# Patient Record
Sex: Female | Born: 1975 | Race: White | Hispanic: No | Marital: Married | Smoking: Former smoker
Health system: Southern US, Community
[De-identification: ages and names within clinical notes are randomized; demographics above are authoritative.]

## PROBLEM LIST (undated history)

## (undated) DIAGNOSIS — K3580 Unspecified acute appendicitis: Secondary | ICD-10-CM

---

## 2005-01-25 ENCOUNTER — Observation Stay: Payer: Self-pay

## 2005-01-25 ENCOUNTER — Inpatient Hospital Stay: Payer: Self-pay

## 2007-03-28 ENCOUNTER — Emergency Department: Payer: Self-pay | Admitting: Emergency Medicine

## 2008-03-28 ENCOUNTER — Ambulatory Visit: Payer: Self-pay | Admitting: Internal Medicine

## 2008-12-04 ENCOUNTER — Inpatient Hospital Stay: Payer: Self-pay

## 2010-07-02 ENCOUNTER — Ambulatory Visit: Payer: Self-pay | Admitting: Internal Medicine

## 2013-04-04 ENCOUNTER — Ambulatory Visit: Payer: Self-pay | Admitting: Obstetrics & Gynecology

## 2014-07-18 ENCOUNTER — Encounter: Payer: Self-pay | Admitting: Emergency Medicine

## 2014-07-18 DIAGNOSIS — D72829 Elevated white blood cell count, unspecified: Secondary | ICD-10-CM | POA: Diagnosis not present

## 2014-07-18 DIAGNOSIS — Z87891 Personal history of nicotine dependence: Secondary | ICD-10-CM | POA: Diagnosis not present

## 2014-07-18 DIAGNOSIS — K353 Acute appendicitis with localized peritonitis: Secondary | ICD-10-CM | POA: Diagnosis not present

## 2014-07-18 DIAGNOSIS — R300 Dysuria: Secondary | ICD-10-CM | POA: Insufficient documentation

## 2014-07-18 DIAGNOSIS — R1031 Right lower quadrant pain: Secondary | ICD-10-CM | POA: Diagnosis present

## 2014-07-18 DIAGNOSIS — K3580 Unspecified acute appendicitis: Secondary | ICD-10-CM | POA: Diagnosis present

## 2014-07-18 LAB — COMPREHENSIVE METABOLIC PANEL
ALK PHOS: 41 U/L (ref 38–126)
ALT: 14 U/L (ref 14–54)
AST: 18 U/L (ref 15–41)
Albumin: 4.5 g/dL (ref 3.5–5.0)
Anion gap: 8 (ref 5–15)
BUN: 14 mg/dL (ref 6–20)
CO2: 26 mmol/L (ref 22–32)
CREATININE: 0.86 mg/dL (ref 0.44–1.00)
Calcium: 9.7 mg/dL (ref 8.9–10.3)
Chloride: 105 mmol/L (ref 101–111)
GFR calc Af Amer: >60 mL/min (ref >60)
GFR calc non Af Amer: >60 mL/min (ref >60)
Glucose, Bld: 123 mg/dL — ABNORMAL HIGH (ref 65–99)
Potassium: 3.8 mmol/L (ref 3.5–5.1)
Sodium: 139 mmol/L (ref 135–145)
Total Bilirubin: 0.4 mg/dL (ref 0.3–1.2)
Total Protein: 7.6 g/dL (ref 6.5–8.1)

## 2014-07-18 LAB — TROPONIN I: Troponin I: <0.03 ng/mL (ref <0.031)

## 2014-07-18 LAB — LIPASE, BLOOD: LIPASE: 28 U/L (ref 22–51)

## 2014-07-18 LAB — CBC WITH DIFFERENTIAL/PLATELET
Basophils Absolute: 0.1 10*3/uL (ref 0–0.1)
Basophils Relative: 1 %
EOS ABS: 0 10*3/uL (ref 0–0.7)
EOS PCT: 0 %
HCT: 39 % (ref 35.0–47.0)
Hemoglobin: 12.9 g/dL (ref 12.0–16.0)
LYMPHS ABS: 1.8 10*3/uL (ref 1.0–3.6)
Lymphocytes Relative: 13 %
MCH: 29.3 pg (ref 26.0–34.0)
MCHC: 33 g/dL (ref 32.0–36.0)
MCV: 88.7 fL (ref 80.0–100.0)
MONOS PCT: 4 %
Monocytes Absolute: 0.6 10*3/uL (ref 0.2–0.9)
Neutro Abs: 12.1 10*3/uL — ABNORMAL HIGH (ref 1.4–6.5)
Neutrophils Relative %: 82 %
Platelets: 224 10*3/uL (ref 150–440)
RBC: 4.39 MIL/uL (ref 3.80–5.20)
RDW: 15.2 % — ABNORMAL HIGH (ref 11.5–14.5)
WBC: 14.7 10*3/uL — ABNORMAL HIGH (ref 3.6–11.0)

## 2014-07-18 MED ORDER — ONDANSETRON 4 MG PO TBDP
ORAL_TABLET | ORAL | Status: AC
  Administered 2014-07-18: 4 mg via ORAL
  Filled 2014-07-18: qty 1

## 2014-07-18 MED ORDER — ONDANSETRON 4 MG PO TBDP
4.0000 mg | ORAL_TABLET | Freq: Once | ORAL | Status: AC
  Administered 2014-07-18: 4 mg via ORAL

## 2014-07-18 MED ORDER — OXYCODONE-ACETAMINOPHEN 5-325 MG PO TABS
1.0000 | ORAL_TABLET | Freq: Once | ORAL | Status: AC
  Administered 2014-07-18: 1 via ORAL

## 2014-07-18 MED ORDER — OXYCODONE-ACETAMINOPHEN 5-325 MG PO TABS
ORAL_TABLET | ORAL | Status: AC
  Administered 2014-07-18: 1 via ORAL
  Filled 2014-07-18: qty 1

## 2014-07-18 NOTE — ED Notes (Addendum)
Patient here with complaint of upper abd pain times one week. Patient reports when the pain initially started it was epigastric pain but tonight she is having right lower abd pain and pain with urination. Patient also reports nausea but no vomiting.

## 2014-07-19 ENCOUNTER — Observation Stay
Admission: EM | Admit: 2014-07-19 | Discharge: 2014-07-20 | Disposition: A | Discharge status: Home / Self Care | Payer: No Typology Code available for payment source | Attending: Surgery | Admitting: Surgery

## 2014-07-19 ENCOUNTER — Observation Stay: Payer: No Typology Code available for payment source | Admitting: Anesthesiology

## 2014-07-19 ENCOUNTER — Encounter
Admission: EM | Disposition: A | Discharge status: Home / Self Care | Payer: Self-pay | Source: Home / Self Care | Attending: Emergency Medicine

## 2014-07-19 ENCOUNTER — Emergency Department: Payer: No Typology Code available for payment source

## 2014-07-19 DIAGNOSIS — K3580 Unspecified acute appendicitis: Secondary | ICD-10-CM

## 2014-07-19 DIAGNOSIS — R1031 Right lower quadrant pain: Secondary | ICD-10-CM

## 2014-07-19 HISTORY — PX: LAPAROSCOPIC APPENDECTOMY: SHX408

## 2014-07-19 HISTORY — DX: Unspecified acute appendicitis: K35.80

## 2014-07-19 LAB — URINALYSIS COMPLETE WITH MICROSCOPIC (ARMC ONLY)
Bilirubin Urine: NEGATIVE
GLUCOSE, UA: NEGATIVE mg/dL
Hgb urine dipstick: NEGATIVE
Ketones, ur: NEGATIVE mg/dL
Leukocytes, UA: NEGATIVE
Nitrite: NEGATIVE
Protein, ur: NEGATIVE mg/dL
Specific Gravity, Urine: 1.019 (ref 1.005–1.030)
pH: 5 (ref 5.0–8.0)

## 2014-07-19 LAB — PREGNANCY, URINE: Preg Test, Ur: NEGATIVE

## 2014-07-19 SURGERY — APPENDECTOMY, LAPAROSCOPIC
Anesthesia: General

## 2014-07-19 MED ORDER — SODIUM CHLORIDE 0.9 % IV BOLUS (SEPSIS)
1000.0000 mL | Freq: Once | INTRAVENOUS | Status: AC
  Administered 2014-07-19: 1000 mL via INTRAVENOUS

## 2014-07-19 MED ORDER — ENOXAPARIN SODIUM 40 MG/0.4ML ~~LOC~~ SOLN
40.0000 mg | SUBCUTANEOUS | Status: DC
  Administered 2014-07-20: 40 mg via SUBCUTANEOUS
  Filled 2014-07-19 (×3): qty 0.4

## 2014-07-19 MED ORDER — PROPOFOL 10 MG/ML IV BOLUS
INTRAVENOUS | Status: DC | PRN
  Administered 2014-07-19: 170 mg via INTRAVENOUS

## 2014-07-19 MED ORDER — BUPIVACAINE-EPINEPHRINE (PF) 0.25% -1:200000 IJ SOLN
INTRAMUSCULAR | Status: DC | PRN
  Administered 2014-07-19: 2 mL via PERINEURAL

## 2014-07-19 MED ORDER — MORPHINE SULFATE 4 MG/ML IJ SOLN
4.0000 mg | INTRAMUSCULAR | Status: DC | PRN
  Administered 2014-07-19 – 2014-07-20 (×3): 4 mg via INTRAVENOUS
  Filled 2014-07-19 (×3): qty 1

## 2014-07-19 MED ORDER — FENTANYL CITRATE (PF) 100 MCG/2ML IJ SOLN
INTRAMUSCULAR | Status: DC | PRN
  Administered 2014-07-19: 100 ug via INTRAVENOUS
  Administered 2014-07-19 (×3): 50 ug via INTRAVENOUS

## 2014-07-19 MED ORDER — LIDOCAINE HCL (CARDIAC) 20 MG/ML IV SOLN
INTRAVENOUS | Status: DC | PRN
  Administered 2014-07-19: 60 mg via INTRAVENOUS

## 2014-07-19 MED ORDER — BUPIVACAINE-EPINEPHRINE (PF) 0.25% -1:200000 IJ SOLN
INTRAMUSCULAR | Status: AC
  Filled 2014-07-19: qty 30

## 2014-07-19 MED ORDER — FENTANYL CITRATE (PF) 100 MCG/2ML IJ SOLN: 25.0000 ug | INTRAMUSCULAR | Status: DC | PRN

## 2014-07-19 MED ORDER — SUGAMMADEX SODIUM 200 MG/2ML IV SOLN
INTRAVENOUS | Status: DC | PRN
  Administered 2014-07-19: 165.6 mg via INTRAVENOUS

## 2014-07-19 MED ORDER — SODIUM CHLORIDE 0.9 % IV BOLUS (SEPSIS): 1000.0000 mL | Freq: Once | INTRAVENOUS | Status: DC

## 2014-07-19 MED ORDER — HYDROMORPHONE HCL 1 MG/ML IJ SOLN
0.2500 mg | INTRAMUSCULAR | Status: DC | PRN
  Administered 2014-07-19 (×4): 0.25 mg via INTRAVENOUS

## 2014-07-19 MED ORDER — MORPHINE SULFATE 4 MG/ML IJ SOLN
INTRAMUSCULAR | Status: AC
  Filled 2014-07-19: qty 1

## 2014-07-19 MED ORDER — IOHEXOL 300 MG/ML  SOLN
100.0000 mL | Freq: Once | INTRAMUSCULAR | Status: AC | PRN
  Administered 2014-07-19: 100 mL via INTRAVENOUS

## 2014-07-19 MED ORDER — ONDANSETRON HCL 4 MG/2ML IJ SOLN
INTRAMUSCULAR | Status: DC | PRN
  Administered 2014-07-19: 4 mg via INTRAVENOUS

## 2014-07-19 MED ORDER — LIDOCAINE HCL (PF) 1 % IJ SOLN
INTRAMUSCULAR | Status: AC
  Filled 2014-07-19: qty 30

## 2014-07-19 MED ORDER — SUCCINYLCHOLINE CHLORIDE 20 MG/ML IJ SOLN
INTRAMUSCULAR | Status: DC | PRN
  Administered 2014-07-19: 100 mg via INTRAVENOUS

## 2014-07-19 MED ORDER — LACTATED RINGERS IV SOLN
INTRAVENOUS | Status: DC | PRN
  Administered 2014-07-19 (×2): via INTRAVENOUS

## 2014-07-19 MED ORDER — ONDANSETRON HCL 4 MG/2ML IJ SOLN: 4.0000 mg | Freq: Once | INTRAMUSCULAR | Status: DC | PRN

## 2014-07-19 MED ORDER — LIDOCAINE HCL 1 % IJ SOLN
INTRAMUSCULAR | Status: DC | PRN
  Administered 2014-07-19: 2 mL

## 2014-07-19 MED ORDER — SCOPOLAMINE 1 MG/3DAYS TD PT72
1.0000 | MEDICATED_PATCH | TRANSDERMAL | Status: DC
  Administered 2014-07-19: 1.5 mg via TRANSDERMAL
  Filled 2014-07-19: qty 1

## 2014-07-19 MED ORDER — HYDROMORPHONE HCL 1 MG/ML IJ SOLN
INTRAMUSCULAR | Status: AC
  Filled 2014-07-19: qty 1

## 2014-07-19 MED ORDER — ONDANSETRON HCL 4 MG/2ML IJ SOLN
INTRAMUSCULAR | Status: AC
  Filled 2014-07-19: qty 2

## 2014-07-19 MED ORDER — ONDANSETRON HCL 4 MG/2ML IJ SOLN
4.0000 mg | Freq: Once | INTRAMUSCULAR | Status: AC
  Administered 2014-07-19: 4 mg via INTRAVENOUS

## 2014-07-19 MED ORDER — MORPHINE SULFATE 4 MG/ML IJ SOLN: 4.0000 mg | Freq: Once | INTRAMUSCULAR | Status: DC

## 2014-07-19 MED ORDER — HYDROMORPHONE HCL 1 MG/ML IJ SOLN: 0.2500 mg | INTRAMUSCULAR | Status: DC | PRN

## 2014-07-19 MED ORDER — MORPHINE SULFATE 4 MG/ML IJ SOLN
4.0000 mg | Freq: Once | INTRAMUSCULAR | Status: AC
  Administered 2014-07-19: 4 mg via INTRAVENOUS

## 2014-07-19 MED ORDER — CEFAZOLIN SODIUM 1-5 GM-% IV SOLN
1.0000 g | INTRAVENOUS | Status: AC
  Administered 2014-07-19: 1 g via INTRAVENOUS
  Filled 2014-07-19: qty 50

## 2014-07-19 MED ORDER — IOHEXOL 240 MG/ML SOLN
25.0000 mL | Freq: Once | INTRAMUSCULAR | Status: AC | PRN
  Administered 2014-07-19: 25 mL via ORAL

## 2014-07-19 MED ORDER — ROCURONIUM BROMIDE 100 MG/10ML IV SOLN
INTRAVENOUS | Status: DC | PRN
  Administered 2014-07-19: 5 mg via INTRAVENOUS
  Administered 2014-07-19: 25 mg via INTRAVENOUS

## 2014-07-19 MED ORDER — DEXAMETHASONE SODIUM PHOSPHATE 4 MG/ML IJ SOLN
INTRAMUSCULAR | Status: DC | PRN
  Administered 2014-07-19: 5 mg via INTRAVENOUS

## 2014-07-19 MED ORDER — ONDANSETRON HCL 4 MG/2ML IJ SOLN: 4.0000 mg | Freq: Once | INTRAMUSCULAR | Status: DC

## 2014-07-19 MED ORDER — MIDAZOLAM HCL 2 MG/2ML IJ SOLN
INTRAMUSCULAR | Status: DC | PRN
  Administered 2014-07-19: 3 mg via INTRAVENOUS
  Administered 2014-07-19: 2 mg via INTRAVENOUS

## 2014-07-19 SURGICAL SUPPLY — 44 items
APPLIER CLIP ROT 10 11.4 M/L (STAPLE) ×3
BAG COUNTER SPONGE EZ (MISCELLANEOUS) IMPLANT
BLADE SURG SZ11 CARB STEEL (BLADE) ×3 IMPLANT
CANISTER SUCT 1200ML W/VALVE (MISCELLANEOUS) ×3 IMPLANT
CATH TRAY 16F METER LATEX (MISCELLANEOUS) ×3 IMPLANT
CHLORAPREP W/TINT 26ML (MISCELLANEOUS) ×3 IMPLANT
CLIP APPLIE ROT 10 11.4 M/L (STAPLE) ×1 IMPLANT
CLOSURE WOUND 1/2 X4 (GAUZE/BANDAGES/DRESSINGS) ×1
COUNTER SPONGE BAG EZ (MISCELLANEOUS)
CUTTER LINEAR ENDO 35 ART THIN (STAPLE) ×3 IMPLANT
DRESSING TELFA 4X3 1S ST N-ADH (GAUZE/BANDAGES/DRESSINGS) ×3 IMPLANT
DRSG TEGADERM 2-3/8X2-3/4 SM (GAUZE/BANDAGES/DRESSINGS) ×9 IMPLANT
DRSG TEGADERM 2X2.25 PEDS (GAUZE/BANDAGES/DRESSINGS) ×3 IMPLANT
DRSG TELFA 3X8 NADH (GAUZE/BANDAGES/DRESSINGS) ×3 IMPLANT
ENDOLOOP SUT PDS II  0 18 (SUTURE) ×2
ENDOLOOP SUT PDS II 0 18 (SUTURE) ×1 IMPLANT
ENDOPOUCH RETRIEVER 10 (MISCELLANEOUS) IMPLANT
GLOVE BIO SURGEON STRL SZ7.5 (GLOVE) ×3 IMPLANT
GOWN STRL REUS W/ TWL LRG LVL3 (GOWN DISPOSABLE) ×2 IMPLANT
GOWN STRL REUS W/TWL LRG LVL3 (GOWN DISPOSABLE) ×4
IRRIGATION STRYKERFLOW (MISCELLANEOUS) IMPLANT
IRRIGATOR STRYKERFLOW (MISCELLANEOUS)
IV NS 1000ML (IV SOLUTION) ×2
IV NS 1000ML BAXH (IV SOLUTION) ×1 IMPLANT
KIT RM TURNOVER STRD PROC AR (KITS) ×3 IMPLANT
LABEL OR SOLS (LABEL) ×3 IMPLANT
NDL SAFETY 25GX1.5 (NEEDLE) ×3 IMPLANT
NS IRRIG 500ML POUR BTL (IV SOLUTION) ×3 IMPLANT
PACK LAP CHOLECYSTECTOMY (MISCELLANEOUS) ×3 IMPLANT
PAD GROUND ADULT SPLIT (MISCELLANEOUS) ×3 IMPLANT
RELOAD CUTTER ETS 35MM STAND (ENDOMECHANICALS) ×3 IMPLANT
SCISSORS METZENBAUM CVD 33 (INSTRUMENTS) ×3 IMPLANT
SEAL FOR SCOPE WARMER C3101 (MISCELLANEOUS) ×3 IMPLANT
SLEEVE ENDOPATH XCEL 5M (ENDOMECHANICALS) ×3 IMPLANT
STRIP CLOSURE SKIN 1/2X4 (GAUZE/BANDAGES/DRESSINGS) ×2 IMPLANT
SUT MNCRL 4-0 (SUTURE) ×2
SUT MNCRL 4-0 27XMFL (SUTURE) ×1
SUT VICRYL 0 AB UR-6 (SUTURE) ×3 IMPLANT
SUTURE MNCRL 4-0 27XMF (SUTURE) ×1 IMPLANT
SWABSTK COMLB BENZOIN TINCTURE (MISCELLANEOUS) ×3 IMPLANT
TROCAR XCEL BLUNT TIP 100MML (ENDOMECHANICALS) ×3 IMPLANT
TROCAR XCEL NON-BLD 5MMX100MML (ENDOMECHANICALS) ×3 IMPLANT
TUBING INSUFFLATOR HI FLOW (MISCELLANEOUS) ×3 IMPLANT
WATER STERILE IRR 1000ML POUR (IV SOLUTION) ×3 IMPLANT

## 2014-07-19 NOTE — H&P (Signed)
Amanda Johnson is a 39 y.o. female  seen in the emergency room with imaging and clinical presentation consistent with probable acute appendicitis.  HPI: She is an otherwise healthy young woman with a weeks worth of intermittent upper quadrant and midepigastric abdominal pain. She felt that she had a problem with significant gas and attempted over-the-counter remedies without any success. She had no other symptoms. Specifically, she had no history of nausea or vomiting. She had no fever or chills.  After lunch today which she normally she began to develop increased periumbilical and lower quadrant abdominal pain associated with anorexia. She had a chill this afternoon. She had no vomiting no diarrhea. The pain intensified primarily in the right lower quadrant and she presented to the emergency room.  She denies a history of hepatitis yellow jaundice pancreatitis peptic ulcer disease gallbladder disease or diverticulitis. Her only previous abdominal surgery was a C-section. She has had a normal vaginal delivery prior to the C-section. Laboratory values reveal slightly elevated white blood cell count and CT scan demonstrated what appeared to be a dilated retrocecal appendix with stranding and free fluid. The surgical service was consulted  Past Medical History  Diagnosis Date  . Appendicitis, acute 07/19/2014   History reviewed. No pertinent past surgical history. History   Social History  . Marital Status: Married    Spouse Name: N/A  . Number of Children: N/A  . Years of Education: N/A   Social History Main Topics  . Smoking status: Former Games developer  . Smokeless tobacco: Not on file  . Alcohol Use: Yes     Comment: occasionly  . Drug Use: No  . Sexual Activity: Not on file   Other Topics Concern  . None   Social History Narrative     Review of Systems  Constitutional: Positive for chills and malaise/fatigue. Negative for fever and weight loss.  HENT: Negative for congestion.   Eyes:  Negative for blurred vision and double vision.  Respiratory: Negative for cough and shortness of breath.   Cardiovascular: Negative for chest pain and orthopnea.  Gastrointestinal: Positive for abdominal pain. Negative for heartburn, nausea, vomiting, diarrhea and constipation.  Genitourinary: Negative for dysuria and frequency.  Musculoskeletal: Positive for back pain. Negative for myalgias.  Skin: Negative for itching and rash.  Neurological: Negative for dizziness, focal weakness and headaches.  Endo/Heme/Allergies: Negative.  Does not bruise/bleed easily.  Psychiatric/Behavioral: Negative for depression and memory loss. The patient is not nervous/anxious.      PHYSICAL EXAM: BP 116/75 mmHg  Pulse 68  Temp(Src) 98.5 F (36.9 C) (Oral)  Resp 18  Ht 5\' 4"  (1.626 m)  Wt 81.647 kg (180 lb)  BMI 30.88 kg/m2  SpO2 99%  LMP 06/25/2014 (Exact Date)  Physical Exam  Constitutional: She is oriented to person, place, and time. She appears well-developed and well-nourished. No distress.  HENT:  Head: Normocephalic and atraumatic.  Eyes: EOM are normal. Pupils are equal, round, and reactive to light.  Neck: Normal range of motion. Neck supple.  Cardiovascular: Normal rate, regular rhythm, normal heart sounds and intact distal pulses.   Pulmonary/Chest: Effort normal and breath sounds normal.  Abdominal: Soft. Bowel sounds are normal. There is tenderness. There is rebound and guarding.  Musculoskeletal: Normal range of motion. She exhibits no edema.  Neurological: She is alert and oriented to person, place, and time.  Skin: Skin is warm and dry.  Psychiatric: She has a normal mood and affect. Judgment normal.   She has marked  right lower quadrant tenderness with mild guarding and mild rebound. She has active bowel sounds.  Impression/Plan: I have independently reviewed her CT scan. She does have a dilated retrocecal appendix with mild free fluid and some stranding. There does not appear  to be any evidence of perforation.  I discussed the options available to her at length. Her husband was present. We discussed primary surgical intervention versus primary antibiotic therapy. Risks of both relative to the patient and her husband. They've chosen surgical intervention. We will admit her to the hospital and planned surgery later this morning. Risks benefits and options been outlined in detail. They're both in agreement.   Tiney Rouge III, MD  07/19/2014, 5:14 AM

## 2014-07-19 NOTE — Transfer of Care (Signed)
Immediate Anesthesia Transfer of Care Note  Patient: Amanda Johnson  Procedure(s) Performed: Procedure(s): APPENDECTOMY LAPAROSCOPIC (N/A)  Patient Location: PACU  Anesthesia Type:General  Level of Consciousness: awake, alert  and oriented  Airway & Oxygen Therapy: Patient Spontanous Breathing and Patient connected to nasal cannula oxygen  Post-op Assessment: Report given to RN and Post -op Vital signs reviewed and stable  Post vital signs: Reviewed and stable  Last Vitals:  Filed Vitals:   07/19/14 0650  BP: 100/60  Pulse: 68  Temp: 36.8 C  Resp: 16    Complications: No apparent anesthesia complications

## 2014-07-19 NOTE — Progress Notes (Signed)
She is doing well post surgery. She does not have any nausea. She has been able to ambulate to the bathroom and is taking liquids without problems. She has some mild incisional pain but no further right lower quadrant pain. She is very pleased with her progress thus far.

## 2014-07-19 NOTE — Anesthesia Preprocedure Evaluation (Addendum)
Anesthesia Evaluation  Patient identified by MRN, date of birth, ID band Patient awake    Reviewed: Allergy & Precautions, NPO status , Patient's Chart, lab work & pertinent test results  History of Anesthesia Complications (+) PONV and history of anesthetic complications  Airway Mallampati: II  TM Distance: >3 FB Neck ROM: Full    Dental no notable dental hx.    Pulmonary neg pulmonary ROS, former smoker,  breath sounds clear to auscultation  Pulmonary exam normal       Cardiovascular Exercise Tolerance: Good negative cardio ROS Normal cardiovascular examRhythm:Regular Rate:Normal     Neuro/Psych negative neurological ROS  negative psych ROS   GI/Hepatic negative GI ROS, Neg liver ROS,   Endo/Other  negative endocrine ROS  Renal/GU negative Renal ROS  negative genitourinary   Musculoskeletal negative musculoskeletal ROS (+)   Abdominal   Peds negative pediatric ROS (+)  Hematology negative hematology ROS (+)   Anesthesia Other Findings   Reproductive/Obstetrics negative OB ROS                            Anesthesia Physical Anesthesia Plan  ASA: II and emergent  Anesthesia Plan: General   Post-op Pain Management:    Induction: Intravenous  Airway Management Planned: Oral ETT  Additional Equipment:   Intra-op Plan:   Post-operative Plan: Extubation in OR  Informed Consent: I have reviewed the patients History and Physical, chart, labs and discussed the procedure including the risks, benefits and alternatives for the proposed anesthesia with the patient or authorized representative who has indicated his/her understanding and acceptance.   Dental advisory given  Plan Discussed with: CRNA and Surgeon  Anesthesia Plan Comments:         Anesthesia Quick Evaluation

## 2014-07-19 NOTE — ED Notes (Signed)
Patient transported to CT 

## 2014-07-19 NOTE — Addendum Note (Signed)
Addendum  created 07/19/14 1251 by Clovis Fredrickson, CRNA   Modules edited: Anesthesia Flowsheet

## 2014-07-19 NOTE — ED Provider Notes (Signed)
[facility] Emergency Department Provider Note  ____________________________________________  Time seen: Approximately 2:44 AM  I have reviewed the triage vital signs and the nursing notes.   HISTORY  Chief Complaint Abdominal Pain    HPI Amanda Johnson is a 39 y.o. female who presents with abdominal pain. Patient describes onset of upper abdominal pain approximately one week ago which she has been experiencing intermittently. Took 2 times tonight which alleviated upper abdominal pain. Today pain migrated into her right lower quadrant. Patient had one episode of painful urination. Patient complains of chills, bloating and nausea without vomiting. She had 2 normal bowel movements today. Appetite was okay but not as good as usual. Patient denies fever, chest pain, shortness of breath, diarrhea, headache, numbness, tingling. Patient denies recent travel or antibiotics use. Nothing makes the pain better or worse. She did attempt to take 2 more times which did not alleviate the lower abdominal pain. Denies pelvic complaints. Denies history of ovarian cysts or endometriosis.   History reviewed. No pertinent past medical history.  There are no active problems to display for this patient.  Past surgical history C-section  No current outpatient prescriptions on file.  Allergies Review of patient's allergies indicates no known allergies.  No family history on file.  Social History History  Substance Use Topics  . Smoking status: Former Games developer  . Smokeless tobacco: Not on file  . Alcohol Use: Yes     Comment: occasionly    Review of Systems Constitutional: No fever. Positive for chills. Eyes: No visual changes. ENT: No sore throat. Cardiovascular: Denies chest pain. Respiratory: Denies shortness of breath. Gastrointestinal: Positive for abdominal pain.  Positive for nausea, no vomiting.  No diarrhea.  No constipation. Genitourinary: Positive for  dysuria. Musculoskeletal: Negative for back pain. Skin: Negative for rash. Neurological: Negative for headaches, focal weakness or numbness.  10-point ROS otherwise negative.  ____________________________________________   PHYSICAL EXAM:  VITAL SIGNS: ED Triage Vitals  Enc Vitals Group     BP 07/18/14 2233 122/67 mmHg     Pulse Rate 07/18/14 2233 89     Resp 07/18/14 2233 18     Temp 07/18/14 2233 98.2 F (36.8 C)     Temp Source 07/18/14 2233 Oral     SpO2 07/18/14 2233 98 %     Weight 07/18/14 2233 180 lb (81.647 kg)     Height 07/18/14 2233  (1.626 m)     Head Cir --      Peak Flow --      Pain Score 07/18/14 2233 7     Pain Loc --      Pain Edu? --      Excl. in GC? --     Constitutional: Alert and oriented. Well appearing and in mild acute distress. Eyes: Conjunctivae are normal. PERRL. EOMI. Head: Atraumatic. Nose: No congestion/rhinnorhea. Mouth/Throat: Mucous membranes are mildly dry.  Oropharynx non-erythematous. Neck: No stridor.   Cardiovascular: Normal rate, regular rhythm. Grossly normal heart sounds.  Good peripheral circulation. Respiratory: Normal respiratory effort.  No retractions. Lungs CTAB. Gastrointestinal: Mildly distended. Moderately tender to palpation right upper and right lower quadrant. Tender maximally at McBurney's point without rebound or guarding. No abdominal bruits. No CVA tenderness. Musculoskeletal: No lower extremity tenderness nor edema.  No joint effusions. Neurologic:  Normal speech and language. No gross focal neurologic deficits are appreciated. Speech is normal.  Skin:  Skin is warm, dry and intact. No rash noted. Psychiatric: Mood and affect are  normal. Speech and behavior are normal.  ____________________________________________   LABS (all labs ordered are listed, but only abnormal results are displayed)  Labs Reviewed  CBC WITH DIFFERENTIAL/PLATELET - Abnormal; Notable for the following:    WBC 14.7 (*)    RDW  15.2 (*)    Neutro Abs 12.1 (*)    All other components within normal limits  COMPREHENSIVE METABOLIC PANEL - Abnormal; Notable for the following:    Glucose, Bld 123 (*)    All other components within normal limits  URINALYSIS COMPLETEWITH MICROSCOPIC ([facility] ONLY) - Abnormal; Notable for the following:    Color, Urine YELLOW (*)    APPearance CLEAR (*)    Bacteria, UA RARE (*)    Squamous Epithelial / LPF 0-5 (*)    All other components within normal limits  LIPASE, BLOOD  TROPONIN I  PREGNANCY, URINE  POC URINE PREG, ED   ____________________________________________  EKG  ED ECG REPORT I, SUNG,JADE J, the attending physician, personally viewed and interpreted this ECG.   Date: 07/19/2014  EKG Time: 0627  Rate: 58  Rhythm: sinus bradycardia  Axis: Normal  Intervals:none  ST&T Change: Nonspecific  ____________________________________________  RADIOLOGY  CT abdomen/pelvis discussed with Dr. Cherly Hensen: 1. Acute appendicitis noted, with associated soft tissue inflammation and trace free fluid tracking about the right lower quadrant. Appendix dilated to 9 mm in maximal diameter. No evidence of perforation or abscess formation at this time. The appendix is retrocecal in nature. 2. Mild prominence of the right ureter may reflect minimal associated inflammation. No evidence of hydronephrosis. 3. Nonspecific 1.1 cm hypodensity at the anterior aspect of the spleen could reflect a small cyst. 4. Disc space narrowing and endplate sclerotic change at L2-L3. ____________________________________________   PROCEDURES  Procedure(s) performed: None  Critical Care performed: No  ____________________________________________   INITIAL IMPRESSION / ASSESSMENT AND PLAN / ED COURSE  Pertinent labs & imaging results that were available during my care of the patient were reviewed by me and considered in my medical decision making (see chart for details).  39 year old female  presenting with abdominal pain initially onset upper pain now in right lower quadrant. Clinical exam and leukocytosis concerning for appendicitis. Plan for IV analgesia, antiemetic; will obtain CT abdomen/pelvis to evaluate infectious/inflammatory etiology.  ----------------------------------------- 4:33 AM on 07/19/2014 -----------------------------------------  CT results discussed with radiologist; patient and spouse updated of CT findings of acute appendicitis without perforation. Discussed case with Dr. Michela Pitcher who will evaluate patient in the ED for hospital admission and surgery. ____________________________________________   FINAL CLINICAL IMPRESSION(S) / ED DIAGNOSES  Final diagnoses:  Right lower quadrant abdominal pain  Acute appendicitis, unspecified acute appendicitis type      Irean Hong, MD 07/19/14 [id number]

## 2014-07-19 NOTE — Anesthesia Procedure Notes (Signed)
Procedure Name: Intubation Date/Time: 07/19/2014 10:25 AM Performed by: Clovis Fredrickson Pre-anesthesia Checklist: Patient identified, Emergency Drugs available, Suction available and Patient being monitored Patient Re-evaluated:Patient Re-evaluated prior to inductionOxygen Delivery Method: Circle system utilized Preoxygenation: Pre-oxygenation with 100% oxygen Intubation Type: IV induction, Cricoid Pressure applied and Rapid sequence Laryngoscope Size: Mac and 4 Grade View: Grade II Tube type: Oral Tube size: 7.0 mm Number of attempts: 1 Placement Confirmation: ETT inserted through vocal cords under direct vision,  positive ETCO2 and breath sounds checked- equal and bilateral Secured at: 21 cm Tube secured with: Tape Dental Injury: Teeth and Oropharynx as per pre-operative assessment

## 2014-07-19 NOTE — ED Notes (Signed)
Surgeon in to speak with pt

## 2014-07-19 NOTE — Progress Notes (Signed)
I have seen and evaluated Ms. Leppla.  No changes to H and P.  Proceed with appendectomy.

## 2014-07-19 NOTE — Brief Op Note (Signed)
07/19/2014  11:29 AM  PATIENT:  Lawson Radar  39 y.o. female  PRE-OPERATIVE DIAGNOSIS:  appendicitis  POST-OPERATIVE DIAGNOSIS:  same  PROCEDURE:  Procedure(s): APPENDECTOMY LAPAROSCOPIC (N/A)  SURGEON:  Surgeon(s) and Role:    * Ida Rogue, MD - Primary  PHYSICIAN ASSISTANT:   ASSISTANTS: none   ANESTHESIA:   general  EBL:  Total I/O In: 1000 [I.V.:1000] Out: 350 [Urine:350] EBL 20 ml  BLOOD ADMINISTERED:none  DRAINS: none   LOCAL MEDICATIONS USED:  LIDOCAINE   SPECIMEN:  Excision  DISPOSITION OF SPECIMEN:  PATHOLOGY  COUNTS:  YES  TOURNIQUET:  * No tourniquets in log *  DICTATION: .Note written in EPIC  PLAN OF CARE: Admit for overnight observation  PATIENT DISPOSITION:  PACU - hemodynamically stable.   Delay start of Pharmacological VTE agent (>24hrs) due to surgical blood loss or risk of bleeding: not applicable

## 2014-07-19 NOTE — Anesthesia Postprocedure Evaluation (Signed)
  Anesthesia Post-op Note  Patient: Amanda Johnson  Procedure(s) Performed: Procedure(s): APPENDECTOMY LAPAROSCOPIC (N/A)  Anesthesia type:General  Patient location: PACU  Post pain: Pain level controlled  Post assessment: Post-op Vital signs reviewed, Patient's Cardiovascular Status Stable, Respiratory Function Stable, Patent Airway and No signs of Nausea or vomiting  Post vital signs: Reviewed and stable  Last Vitals:  Filed Vitals:   07/19/14 1156  BP: 109/69  Pulse: 57  Temp:   Resp: 16    Level of consciousness: awake, alert  and patient cooperative  Complications: No apparent anesthesia complications

## 2014-07-19 NOTE — Op Note (Signed)
Preoperative dx: Appendicitis Postoperative dx: Appendicitis Procedure: Laparoscopic appendectomy  Surgeon: Dasani Crear Anesthesia: GETA EBL: 25 ml Specimen: Appendix.  Indication for procedure: Ms Vanmarter presented with 1 day of RLQ pain and clinical and radiographic signs of appendicitis.  We brought her to the OR for surgical management of appendicitis.  Details of procedure:  Informed consent was obtained.  Ms. Leath was brought to the or suite and laid on the OR table.  She was induced, intubated and general anesthesia administered.  Her abdomen was prepped and draped.  Time out performed.  Incision was made over umbilicus.  Deepened down to fascia and fascia incised.  Two stay sutures placed in fasciotomy, hassan trocar placed, abdomen was insufflated.  Two 5 mm trocars were placed under direct visualization in RLQ and suprapubic.  The appendix was visualized and remarkably inflammed.  Hole was made in mesoappendix and appendix transected flush with cecum using endoGIA stapler.  Mesoappendix was then divided with 2 firings of the endoGIA.  Appendix then removed using endocatch bag.  Abdomen irrigated, hemostasis obtained.  All trocars were then removed under direct visualization.  Supraumbilical fascia closed using previously placed stay sutures.  All skin sites closed with 4-0 monocryl interruped deep dermal sutures.  Steristrips, telfa and tegaderm were used to complete dressing.  Patient awoken, extubated and brought to PACU.  No immediate complications, needle, sponge and instrument count correct at end of procedure.

## 2014-07-20 MED ORDER — HYDROCODONE-ACETAMINOPHEN 5-325 MG PO TABS
1.0000 | ORAL_TABLET | ORAL | Status: DC | PRN
  Administered 2014-07-20: 2 via ORAL
  Filled 2014-07-20: qty 2

## 2014-07-20 MED ORDER — HYDROCODONE-ACETAMINOPHEN 5-325 MG PO TABS: 1.0000 | ORAL_TABLET | ORAL | Status: DC | PRN

## 2014-07-20 NOTE — Discharge Summary (Signed)
Admission Diagnosis: Acute Appendicitis Procedure Performed: Laparoscopic appendectomy  Discharge medications   Medication List    TAKE these medications        HYDROcodone-acetaminophen 5-325 MG per tablet  Commonly known as:  NORCO/VICODIN  Take 1-2 tablets by mouth every 4 (four) hours as needed for moderate pain.     loratadine 10 MG tablet  Commonly known as:  CLARITIN  Take 10 mg by mouth daily.       Indication for admission: Amanda Johnson presented with clinical and radiographic appendicitis.  She was admitted for management of appendicitis.  Hospital Course: Amanda Johnson underwent unremarkable appendectomy.  Postoperatively she was began on clear liquid diet with IV pain medications.  Her diet was advanced as tolerated and she progressed to PO pain meds. At time of discharge, Amanda Johnson was tolerating a regular diet with good oral pain control.

## 2014-07-20 NOTE — Discharge Instructions (Signed)
Do not drive on pain medications °Do not lift greater than 15 lbs for a period of 6 weeks °Call or return to ER if you develop fever greater than 101.5, nausea/vomiting, increased pain, redness/drainage from incisions °Take bandages off in 48 hours.  Okay to shower with bandages on or after they come off, no tub baths °

## 2014-07-20 NOTE — Progress Notes (Signed)
Surgery Progress Note  S: Doing well.  Pain improved, no nausea O: AF/VSS, good uop GEN: NAD/A&Ox3 ABD: soft, min tender, nondistended  A/P 39 yo s/p lap appendectomy, doing well - possible d/c later

## 2014-07-21 ENCOUNTER — Encounter: Payer: Self-pay | Admitting: Surgery

## 2014-07-22 LAB — SURGICAL PATHOLOGY

## 2014-07-31 ENCOUNTER — Ambulatory Visit: Payer: No Typology Code available for payment source | Admitting: Surgery

## 2016-05-08 IMAGING — CT CT ABD-PELV W/ CM
2 of 5 series · 16 of 46 positions shown, 18 images · IV contrast (omnipaque)
Comparison: Pelvic ultrasound performed 03/28/2007

CLINICAL DATA: Acute onset of upper abdominal pain for 1 week.
Right lower quadrant abdominal pain and dysuria. Nausea. Initial
encounter.

EXAM:
CT ABDOMEN AND PELVIS WITH CONTRAST
TECHNIQUE: Multidetector CT imaging of the abdomen and pelvis was performed
using the standard protocol following bolus administration of
intravenous contrast.
CONTRAST:  100mL OMNIPAQUE IOHEXOL 300 MG/ML  SOLN

[Series 2: routine abd pel with · axial · 0.71mm/px · z∈[-494,-88]mm · 13 of 91 slices shown, 15 images]
[im 5/91  soft-tissue]
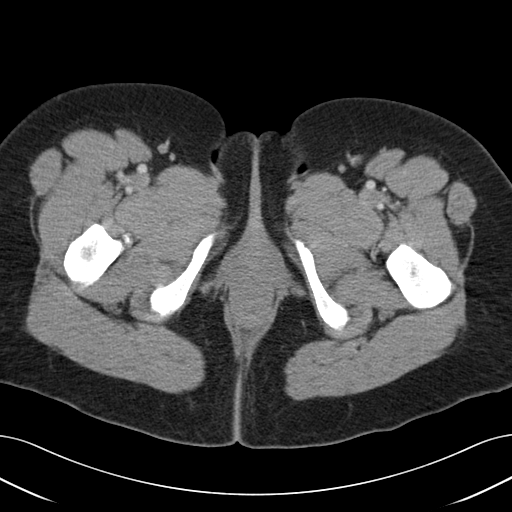
[im 5/91  bone]
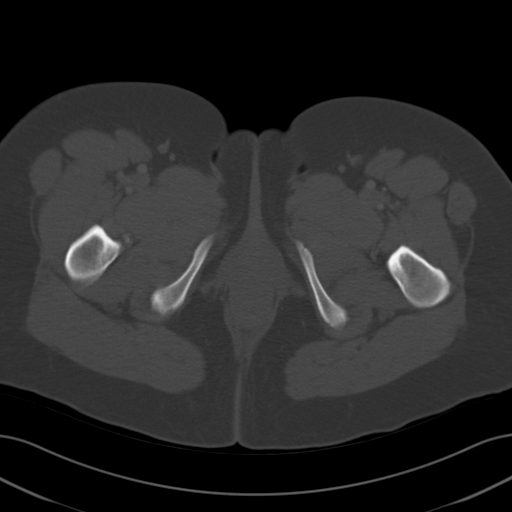
[im 15/91  soft-tissue]
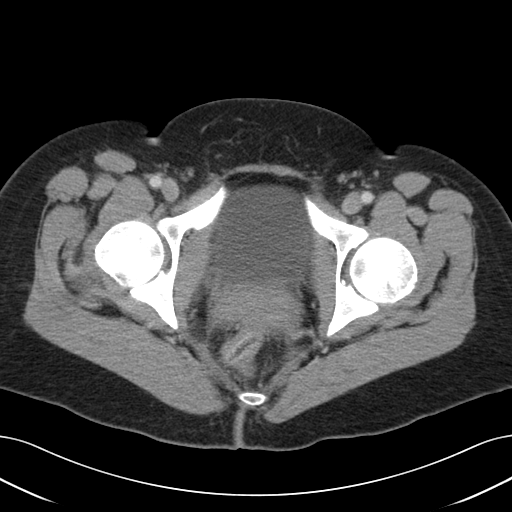
[im 19/91  soft-tissue]
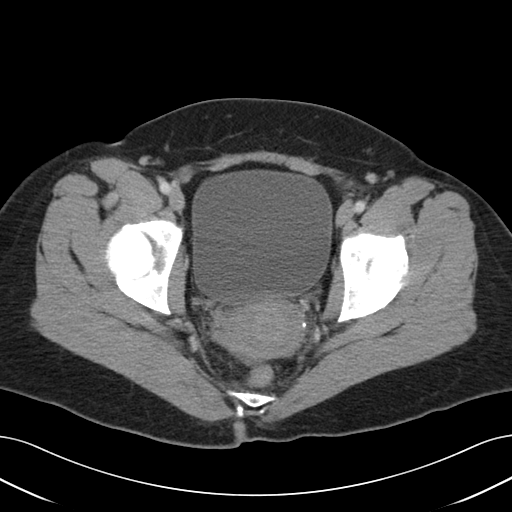
[im 24/91  soft-tissue]
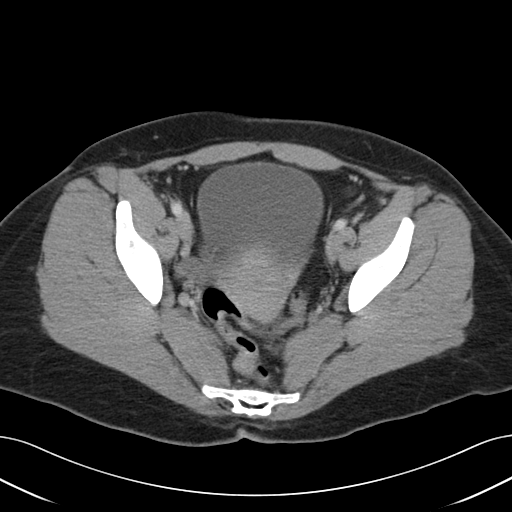
[im 34/91  soft-tissue]
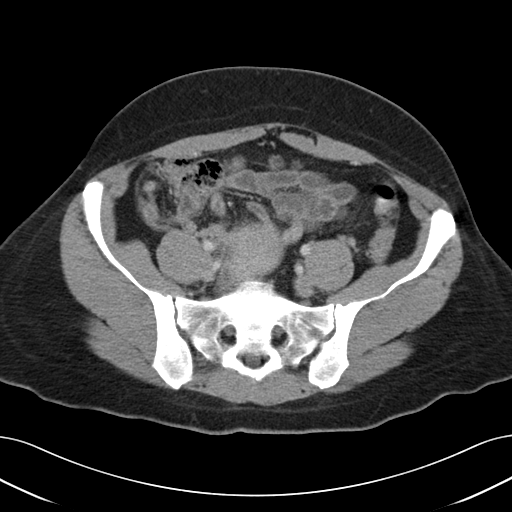
[im 38/91  soft-tissue]
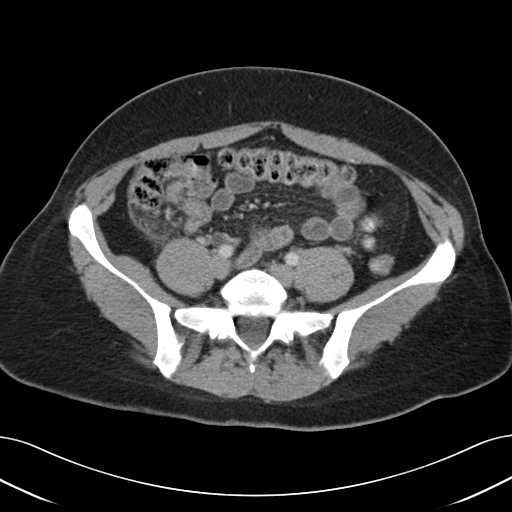
[im 48/91  soft-tissue]
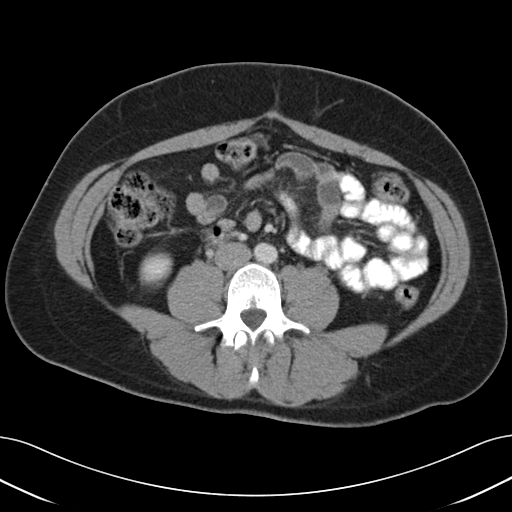
[im 53/91  soft-tissue]
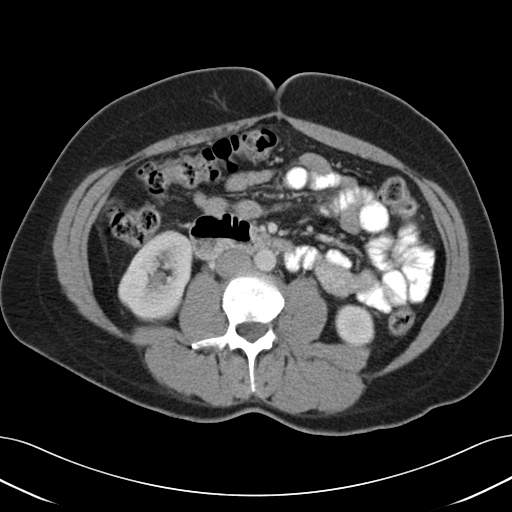
[im 57/91  soft-tissue]
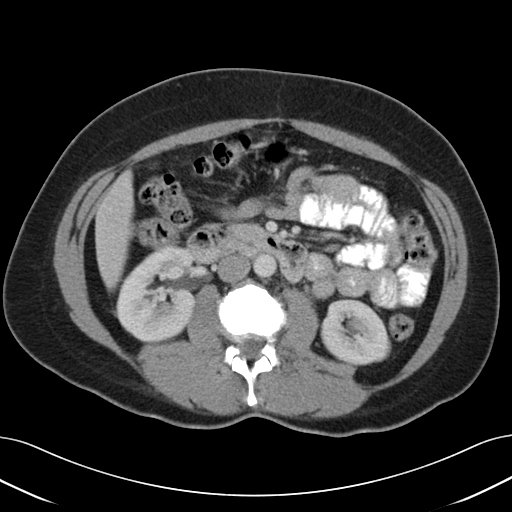
[im 57/91  bone]
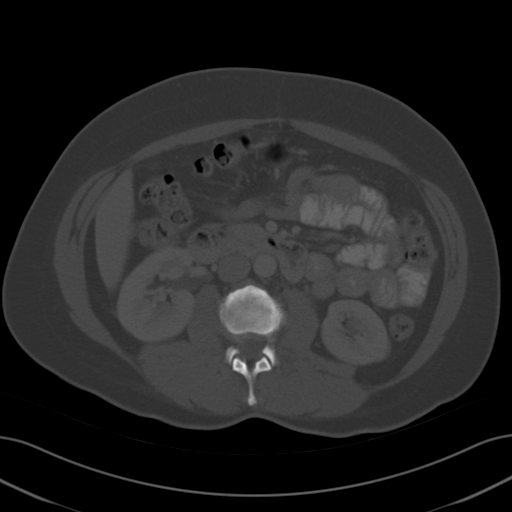
[im 67/91  soft-tissue]
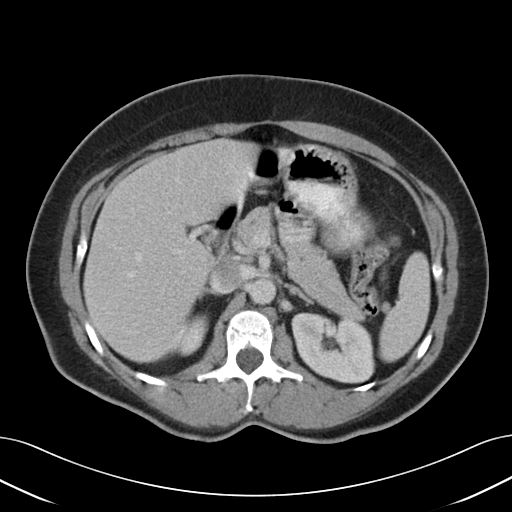
[im 72/91  soft-tissue]
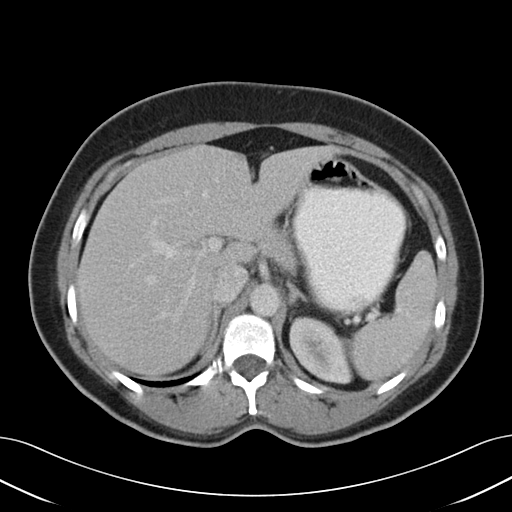
[im 76/91  soft-tissue]
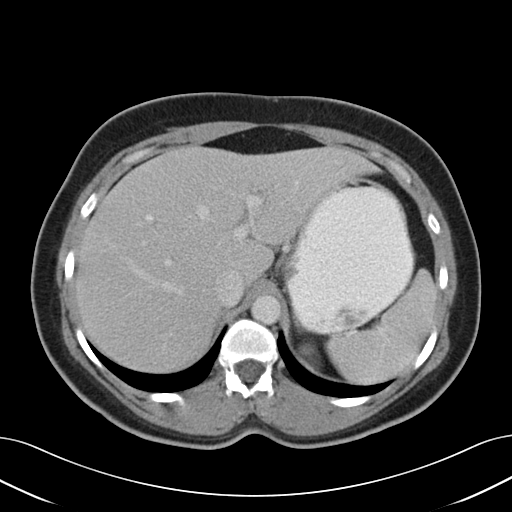
[im 86/91  soft-tissue]
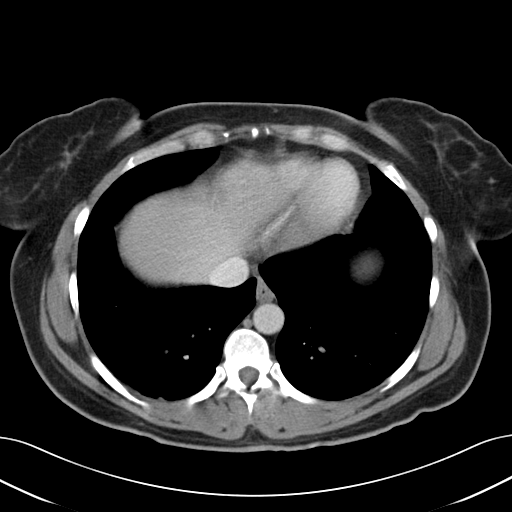

[Series 5: cor routine abd pel with · coronal · 0.93mm/px · 3 of 119 slices shown]
[im 40/119  soft-tissue]
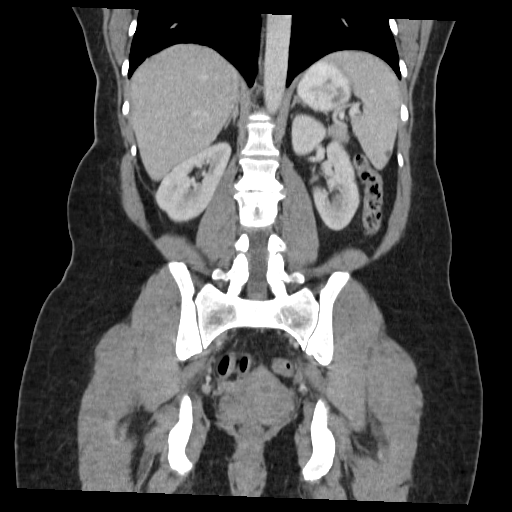
[im 53/119  soft-tissue]
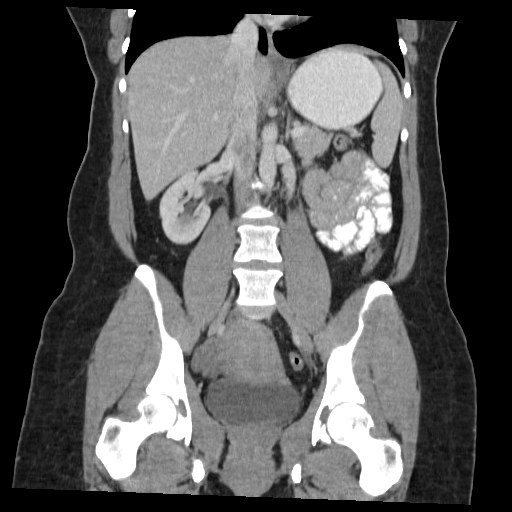
[im 66/119  soft-tissue]
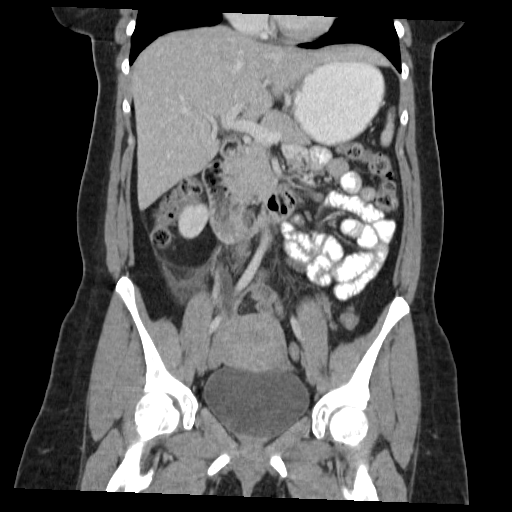

[16 of 46 positions shown; findings below may reference images not displayed]

FINDINGS: The visualized lung bases are clear.

A 1.1 cm hypodensity at the anterior aspect of the spleen could
reflect a small cyst. The gallbladder is within normal limits. The
pancreas and adrenal glands are unremarkable.

The kidneys are unremarkable in appearance. There is no evidence of
hydronephrosis. No renal or ureteral stones are seen. No perinephric
stranding is appreciated.

No free fluid is identified. The small bowel is unremarkable in
appearance. The stomach is within normal limits. No acute vascular
abnormalities are seen.

The appendix is dilated to 9 mm in maximal diameter, with increased
wall enhancement and soft tissue inflammation. A small amount of
free fluid is seen tracking about the right lower quadrant. The
appendix is retrocecal in nature. Mild prominence of the right
ureter may reflect minimal associated inflammation. There is no
evidence of perforation or abscess formation at this time.

The colon is unremarkable in appearance.

The bladder is moderately distended and grossly unremarkable. The
uterus is within normal limits. The ovaries are grossly symmetric.
No suspicious adnexal masses are seen. No inguinal lymphadenopathy
is seen.

No acute osseous abnormalities are identified. Disc space narrowing
and endplate sclerotic change are noted at L2-L3.
IMPRESSION: 1. Acute appendicitis noted, with associated soft tissue
inflammation and trace free fluid tracking about the right lower
quadrant. Appendix dilated to 9 mm in maximal diameter. No evidence
of perforation or abscess formation at this time. The appendix is
retrocecal in nature.
2. Mild prominence of the right ureter may reflect minimal
associated inflammation. No evidence of hydronephrosis.
3. Nonspecific 1.1 cm hypodensity at the anterior aspect of the
spleen could reflect a small cyst.
4. Disc space narrowing and endplate sclerotic change at L2-L3.

These results were called by telephone at the time of interpretation
on 07/19/2014 at [DATE] to Dr. MOFIDUL TIGER, who verbally acknowledged
these results.

## 2017-11-27 ENCOUNTER — Encounter: Payer: Self-pay | Admitting: Obstetrics & Gynecology

## 2017-11-27 ENCOUNTER — Ambulatory Visit (INDEPENDENT_AMBULATORY_CARE_PROVIDER_SITE_OTHER): Payer: BC Managed Care – PPO | Admitting: Obstetrics & Gynecology

## 2017-11-27 VITALS — BP 120/80 | Ht 64.0 in | Wt 197.0 lb

## 2017-11-27 DIAGNOSIS — Z1329 Encounter for screening for other suspected endocrine disorder: Secondary | ICD-10-CM

## 2017-11-27 DIAGNOSIS — Z01419 Encounter for gynecological examination (general) (routine) without abnormal findings: Secondary | ICD-10-CM

## 2017-11-27 DIAGNOSIS — Z1321 Encounter for screening for nutritional disorder: Secondary | ICD-10-CM

## 2017-11-27 DIAGNOSIS — Z1239 Encounter for other screening for malignant neoplasm of breast: Secondary | ICD-10-CM

## 2017-11-27 DIAGNOSIS — Z131 Encounter for screening for diabetes mellitus: Secondary | ICD-10-CM

## 2017-11-27 DIAGNOSIS — Z0000 Encounter for general adult medical examination without abnormal findings: Secondary | ICD-10-CM

## 2017-11-27 DIAGNOSIS — Z124 Encounter for screening for malignant neoplasm of cervix: Secondary | ICD-10-CM

## 2017-11-27 DIAGNOSIS — Z13220 Encounter for screening for lipoid disorders: Secondary | ICD-10-CM

## 2017-11-27 NOTE — Patient Instructions (Signed)
PAP every three years Mammogram every year    Call [phone] to schedule at [facility] today 

## 2017-11-27 NOTE — Progress Notes (Signed)
HPI:      [name] is a 42 y.o. [id number] who LMP was Patient's last menstrual period was 11/25/2017., she presents today for her annual examination. The patient has no complaints today. The patient is sexually active. Her last pap: approximate date 2015 and was normal and last mammogram: approximate date 2015 and was normal. The patient does perform self breast exams.  There is no notable family history of breast or ovarian cancer in her family.  The patient has regular exercise: yes.  The patient denies current symptoms of depression.  Desires pregnancy if possible, depressed it has not happened and understands it may well not happen at this point.  Periods reg.  GYN History: Contraception: none  PMHx: Past Medical History:  Diagnosis Date  . Appendicitis, acute 07/19/2014   Past Surgical History:  Procedure Laterality Date  . LAPAROSCOPIC APPENDECTOMY N/A 07/19/2014   Procedure: APPENDECTOMY LAPAROSCOPIC;  Surgeon: Ida Rogue, MD;  Location: [facility] ORS;  Service: General;  Laterality: N/A;   History reviewed. No pertinent family history. Social History   Tobacco Use  . Smoking status: Former Games developer  . Smokeless tobacco: Never Used  Substance Use Topics  . Alcohol use: Yes    Comment: occasionly  . Drug use: No    Current Outpatient Medications:  .  loratadine (CLARITIN) 10 MG tablet, Take 10 mg by mouth daily., Disp: , Rfl:  Allergies: Patient has no known allergies.  Review of Systems  Constitutional: Negative for chills, fever and malaise/fatigue.  HENT: Negative for congestion, sinus pain and sore throat.   Eyes: Negative for blurred vision and pain.  Respiratory: Negative for cough and wheezing.   Cardiovascular: Negative for chest pain and leg swelling.  Gastrointestinal: Negative for abdominal pain, constipation, diarrhea, heartburn, nausea and vomiting.  Genitourinary: Negative for dysuria, frequency, hematuria and urgency.  Musculoskeletal:  Negative for back pain, joint pain, myalgias and neck pain.  Skin: Negative for itching and rash.  Neurological: Negative for dizziness, tremors and weakness.  Endo/Heme/Allergies: Does not bruise/bleed easily.  Psychiatric/Behavioral: Negative for depression. The patient is not nervous/anxious and does not have insomnia.     Objective: BP 120/80   Ht 5\' 4"  (1.626 m)   Wt 197 lb (89.4 kg)   LMP 11/25/2017   BMI 33.81 kg/m   Filed Weights   11/27/17 1527  Weight: 197 lb (89.4 kg)   Body mass index is 33.81 kg/m. Physical Exam  Constitutional: She is oriented to person, place, and time. She appears well-developed and well-nourished. No distress.  Genitourinary: Rectum normal, vagina normal and uterus normal. Pelvic exam was performed with patient supine. There is no rash or lesion on the right labia. There is no rash or lesion on the left labia. Vagina exhibits no lesion. No bleeding in the vagina. Right adnexum does not display mass and does not display tenderness. Left adnexum does not display mass and does not display tenderness. Cervix does not exhibit motion tenderness, lesion, friability or polyp.   Uterus is mobile and midaxial. Uterus is not enlarged or exhibiting a mass.  HENT:  Head: Normocephalic and atraumatic. Head is without laceration.  Right Ear: Hearing normal.  Left Ear: Hearing normal.  Nose: No epistaxis.  No foreign bodies.  Mouth/Throat: Uvula is midline, oropharynx is clear and moist and mucous membranes are normal.  Eyes: Pupils are equal, round, and reactive to light.  Neck: Normal range of motion. Neck supple. No thyromegaly present.  Cardiovascular: Normal rate and  regular rhythm. Exam reveals no gallop and no friction rub.  No murmur heard. Pulmonary/Chest: Effort normal and breath sounds normal. No respiratory distress. She has no wheezes. Right breast exhibits no mass, no skin change and no tenderness. Left breast exhibits no mass, no skin change and no  tenderness.  Abdominal: Soft. Bowel sounds are normal. She exhibits no distension. There is no tenderness. There is no rebound.  Musculoskeletal: Normal range of motion.  Neurological: She is alert and oriented to person, place, and time. No cranial nerve deficit.  Skin: Skin is warm and dry.  Psychiatric: She has a normal mood and affect. Judgment normal.  Vitals reviewed.   Assessment:  ANNUAL EXAM 1. Annual physical exam   2. Screening for cervical cancer   3. Screening for breast cancer   4. Screening for diabetes mellitus   5. Screening for thyroid disorder   6. Screening for cholesterol level   7. Encounter for vitamin deficiency screening      Screening Plan:            1.  Cervical Screening-  Pap smear done today  2. Breast screening- Exam annually and mammogram>40 planned   3. Colonoscopy every 10 years, Hemoccult testing - after age 13  4. Labs Ordered today  5. Counseling for contraception: no method    F/U  Return in about 1 year (around 11/28/2018) for Annual.  Annamarie Major, MD, [facility], [facility] 11/27/2017  4:04 PM

## 2017-11-28 ENCOUNTER — Other Ambulatory Visit: Payer: Self-pay | Admitting: Obstetrics & Gynecology

## 2017-11-28 DIAGNOSIS — R7989 Other specified abnormal findings of blood chemistry: Secondary | ICD-10-CM

## 2017-11-28 DIAGNOSIS — Z131 Encounter for screening for diabetes mellitus: Secondary | ICD-10-CM

## 2017-11-28 DIAGNOSIS — Z1321 Encounter for screening for nutritional disorder: Secondary | ICD-10-CM

## 2017-11-28 LAB — VITAMIN D 25 HYDROXY (VIT D DEFICIENCY, FRACTURES): Vit D, 25-Hydroxy: 24.1 ng/mL — ABNORMAL LOW (ref 30.0–100.0)

## 2017-11-28 LAB — VITAMIN B12: Vitamin B-12: 419 pg/mL (ref 232–1245)

## 2017-11-28 LAB — HEMOGLOBIN A1C
Est. average glucose Bld gHb Est-mCnc: 117 mg/dL
Hgb A1c MFr Bld: 5.7 % — ABNORMAL HIGH (ref 4.8–5.6)

## 2017-11-28 LAB — TSH: TSH: 5.94 u[IU]/mL — ABNORMAL HIGH (ref 0.450–4.500)

## 2017-11-28 NOTE — Progress Notes (Signed)
Pt aware.

## 2017-11-28 NOTE — Progress Notes (Signed)
Left message to return call 

## 2017-11-28 NOTE — Progress Notes (Signed)
Let her know lab results with my comments are on My Chart.  Orders placed to recheck levels in 3 months.  Take Vitamin D daily and monitor sugar intake during that time.  Hopefully the borderline abnormalities will resolve by then.

## 2017-11-30 LAB — CYTOLOGY - PAP
Diagnosis: NEGATIVE
HPV: NOT DETECTED

## 2017-12-11 ENCOUNTER — Ambulatory Visit
Admission: RE | Admit: 2017-12-11 | Discharge: 2017-12-11 | Disposition: A | Discharge status: Home / Self Care | Payer: BC Managed Care – PPO | Source: Ambulatory Visit | Attending: Obstetrics & Gynecology | Admitting: Obstetrics & Gynecology

## 2017-12-11 DIAGNOSIS — Z1239 Encounter for other screening for malignant neoplasm of breast: Secondary | ICD-10-CM | POA: Diagnosis present

## 2023-08-28 ENCOUNTER — Ambulatory Visit
Admission: EM | Admit: 2023-08-28 | Discharge: 2023-08-28 | Disposition: A | Discharge status: Home / Self Care | Payer: Self-pay | Attending: Family Medicine | Admitting: Family Medicine

## 2023-08-28 DIAGNOSIS — R21 Rash and other nonspecific skin eruption: Secondary | ICD-10-CM

## 2023-08-28 MED ORDER — ALBENDAZOLE 200 MG PO TABS: 400.0000 mg | ORAL_TABLET | Freq: Every day | ORAL | 0 refills | Status: AC

## 2023-08-28 MED ORDER — TRIAMCINOLONE ACETONIDE 0.1 % EX OINT: 1.0000 | TOPICAL_OINTMENT | Freq: Two times a day (BID) | CUTANEOUS | 0 refills | Status: AC

## 2023-08-28 NOTE — ED Triage Notes (Signed)
 Patient states that she went camping on the beach over the weekend and now has a rash on her right thigh and butt cheek

## 2023-08-28 NOTE — Discharge Instructions (Addendum)
 Stop by the pharmacy to pick up your prescriptions.  Follow up with your primary care provider or return to the urgent care, if not improving.

## 2023-08-28 NOTE — ED Provider Notes (Signed)
 [facility]    CSN: [id number] Arrival date & time: 08/28/23  9060      History   Chief Complaint Chief Complaint  Patient presents with   Rash    HPI LILIANA DANG is a 48 y.o. female.   HPI  Akeelah presents for 2 days of itchy right thigh and buttocks rash after camping on the beach. She was seated on the sand.  Has burning sensation on her buttocks. She notes she did not go into the water therefore likely not jellyfish sting. She did put her drink on her lap after taking it from the sand.   Applied some natural bug spray but no other new products including soaps and detergents.  No eye irritation, sore throat, difficulty breathing, nausea, vomiting or diarrhea.  Denies belly pain, joint pain and fever.  There has been no medication changes or new supplements.  Denies any new foods or drinks.     Past Medical History:  Diagnosis Date   Appendicitis, acute 07/19/2014    Patient Active Problem List   Diagnosis Date Noted   Appendicitis, acute 07/19/2014    Past Surgical History:  Procedure Laterality Date   LAPAROSCOPIC APPENDECTOMY N/A 07/19/2014   Procedure: APPENDECTOMY LAPAROSCOPIC;  Surgeon: Lonni Brands, MD;  Location: [facility] ORS;  Service: General;  Laterality: N/A;    OB History     Gravida  2   Para  2   Term  1   Preterm  1   AB      Living  2      SAB      IAB      Ectopic      Multiple      Live Births               Home Medications    Prior to Admission medications   Medication Sig Start Date End Date Taking? Authorizing Provider  albendazole  (ALBENZA ) 200 MG tablet Take 2 tablets (400 mg total) by mouth daily for 3 doses. 08/28/23 08/31/23 Yes Mekenna Finau, DO  triamcinolone  ointment (KENALOG ) 0.1 % Apply 1 Application topically 2 (two) times daily. 08/28/23  Yes Ardith Lewman, DO  loratadine (CLARITIN) 10 MG tablet Take 10 mg by mouth daily.    [provider]    Family History Family History   Problem Relation Age of Onset   Breast cancer Neg Hx     Social History Social History   Tobacco Use   Smoking status: Former   Smokeless tobacco: Never  Vaping Use   Vaping status: Never Used  Substance Use Topics   Alcohol use: Yes    Comment: occasionly   Drug use: No     Allergies   Patient has no known allergies.   Review of Systems Review of Systems :negative unless otherwise stated in HPI.      Physical Exam Triage Vital Signs ED Triage Vitals  Encounter Vitals Group     BP 08/28/23 1047 103/67     Girls Systolic BP Percentile --      Girls Diastolic BP Percentile --      Boys Systolic BP Percentile --      Boys Diastolic BP Percentile --      Pulse Rate 08/28/23 1047 (!) 52     Resp 08/28/23 1047 17     Temp 08/28/23 1047 98.5 F (36.9 C)     Temp Source 08/28/23 1047 Oral     SpO2 08/28/23 1047  100 %     Weight --      Height --      Head Circumference --      Peak Flow --      Pain Score 08/28/23 1046 3     Pain Loc --      Pain Education --      Exclude from Growth Chart --    No data found.  Updated Vital Signs BP 103/67 (BP Location: Left Arm)   Pulse (!) 52   Temp 98.5 F (36.9 C) (Oral)   Resp 17   SpO2 100%   Visual Acuity Right Eye Distance:   Left Eye Distance:   Bilateral Distance:    Right Eye Near:   Left Eye Near:    Bilateral Near:     Physical Exam  GEN: alert, well appearing female, in no acute distress  EYES: no scleral injection or discharge CV: regular rate  RESP: no increased work of breathing MSK: no extremity edema  NEURO: alert, moves all extremities appropriately SKIN: warm and dry; erythematous blistering patches on right buttock, right medial thigh and left medial thigh with erythematous tracts        UC Treatments / Results  Labs (all labs ordered are listed, but only abnormal results are displayed) Labs Reviewed - No data to display  EKG   Radiology No results  found.  Procedures Procedures (including critical care time)  Medications Ordered in UC Medications - No data to display  Initial Impression / Assessment and Plan / UC Course  I have reviewed the triage vital signs and the nursing notes.  Pertinent labs & imaging results that were available during my care of the patient were reviewed by me and considered in my medical decision making (see chart for details).     Patient is a 48 y.o. femalewho presents for thigh and buttock erythematous and pruritic rash.  Overall, patient is well-appearing and well-hydrated.  Vital signs stable.  Keeya is afebrile.  Exam concerning for hookworm larvae .  Treat with steroid ointment and Albendazole  as below. No sign of infection to suggest antibiotics or antifungals at this time.  Not likely viral exanthem.   Reviewed expectations regarding course of current medical issues.  All questions asked were answered.  Outlined signs and symptoms indicating need for more acute intervention. Patient verbalized understanding. After Visit Summary given.   Final Clinical Impressions(s) / UC Diagnoses   Final diagnoses:  Rash     Discharge Instructions      Stop by the pharmacy to pick up your prescriptions.  Follow up with your primary care provider or return to the urgent care, if not improving.        ED Prescriptions     Medication Sig Dispense Auth. Provider   triamcinolone  ointment (KENALOG ) 0.1 % Apply 1 Application topically 2 (two) times daily. 30 g Myrla Malanowski, DO   albendazole  (ALBENZA ) 200 MG tablet Take 2 tablets (400 mg total) by mouth daily for 3 doses. 6 tablet Kriste Berth, DO      PDMP not reviewed this encounter.              Siegfried Vieth, DO 08/28/23 1213

## 2024-01-04 ENCOUNTER — Ambulatory Visit
Admission: EM | Admit: 2024-01-04 | Discharge: 2024-01-04 | Disposition: A | Discharge status: Home / Self Care | Payer: Self-pay | Source: Home / Self Care

## 2024-01-04 DIAGNOSIS — K602 Anal fissure, unspecified: Secondary | ICD-10-CM

## 2024-01-04 DIAGNOSIS — R300 Dysuria: Secondary | ICD-10-CM

## 2024-01-04 DIAGNOSIS — K649 Unspecified hemorrhoids: Secondary | ICD-10-CM

## 2024-01-04 LAB — POCT URINE DIPSTICK
Bilirubin, UA: NEGATIVE
Blood, UA: NEGATIVE
Glucose, UA: NEGATIVE mg/dL
Ketones, POC UA: NEGATIVE mg/dL
Leukocytes, UA: NEGATIVE
Nitrite, UA: NEGATIVE
Protein Ur, POC: 30 mg/dL — AB
Spec Grav, UA: >=1.03 — AB (ref 1.010–1.025)
Urobilinogen, UA: 0.2 U/dL
pH, UA: 6.5 (ref 5.0–8.0)

## 2024-01-04 MED ORDER — HYDROCORTISONE ACETATE 25 MG RE SUPP: 25.0000 mg | Freq: Two times a day (BID) | RECTAL | 0 refills | Status: AC | PRN

## 2024-01-04 NOTE — ED Triage Notes (Signed)
 Pt c/o possible UTI x2days and anal fissure x4days  Pt states that she works out and has a history of hemorrhoids.  Pt states that since September, she believes that she has a anal fissure and has been having pain after bowel movements.   Pt states that she is having urinary urgency  Pt has been using OTC nupercainal gel 20mg  for numbing.   Pt denies vaginal odor, itching, or irritation

## 2024-01-04 NOTE — ED Provider Notes (Signed)
 [facility]    CSN: [id number] Arrival date & time: 01/04/24  1013      History   Chief Complaint Chief Complaint  Patient presents with   Urinary Tract Infection   Anal Fissure    HPI Amanda Johnson is a 48 y.o. female presenting for bladder discomfort.  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  She says the anal pain has been worse recently and she feels pain into her vaginal area.  She is concerned for possible UTI.  Denies painful urination, frequency, urgency, difficulty urinating.  Denies abdominal pain, pelvic pain, vaginal discharge or odor.  No concern for STI.  Has been using a lidocaine  numbing gel in the hemorrhoids, warm sitz bath's and a donut pillow without relief.  Also tried topical Preparation H but has not tried any suppositories.  States that she has struggled with hemorrhoids for a long time.  She is confident she has hemorrhoids and a couple anal fissures.  Denies any active bleeding but reports a lot of pain with bowel movements.  Denies constipation.  States she has BMs every morning which causes her to have pain the rest of the day.  No other complaints.  HPI  Past Medical History:  Diagnosis Date   Appendicitis, acute 07/19/2014    Patient Active Problem List   Diagnosis Date Noted   Appendicitis, acute 07/19/2014    Past Surgical History:  Procedure Laterality Date   LAPAROSCOPIC APPENDECTOMY N/A 07/19/2014   Procedure: APPENDECTOMY LAPAROSCOPIC;  Surgeon: Lonni Brands, MD;  Location: [facility];  Service: General;  Laterality: N/A;    OB History     Gravida  2   Para  2   Term  1   Preterm  1   AB      Living  2      SAB      IAB      Ectopic      Multiple      Live Births               Home Medications    Prior to Admission medications  Medication Sig Start Date End Date Taking? Authorizing Provider  hydrocortisone  (ANUSOL-HC) 25 MG suppository Place 1 suppository (25 mg total) rectally 2 (two) times daily as needed for hemorrhoids or anal itching. 01/04/24  Yes Arvis Huxley B, PA-C  loratadine (CLARITIN) 10 MG tablet Take 10 mg by mouth daily.   Yes [provider]  triamcinolone  ointment (KENALOG ) 0.1 % Apply 1 Application topically 2 (two) times daily. 08/28/23   Brimage, Vondra, DO    Family History Family History  Problem Relation Age of Onset   Breast cancer Neg Hx     Social History Social History[1]   Allergies   Patient has no known allergies.   Review of Systems Review of Systems  Constitutional:  Negative for chills, fatigue and fever.  Gastrointestinal:  Positive for abdominal pain and rectal pain. Negative for abdominal distention, anal bleeding, blood in stool, constipation, diarrhea, nausea and vomiting.  Genitourinary:  Negative for decreased urine volume, difficulty urinating, dysuria, flank pain, frequency, hematuria, pelvic pain, urgency, vaginal bleeding, vaginal discharge and vaginal pain.  Musculoskeletal:  Negative for back pain.  Skin:  Negative for rash.     Physical Exam Triage Vital Signs ED Triage Vitals  Encounter Vitals Group     BP  Girls Systolic BP Percentile      Girls Diastolic BP Percentile      Boys Systolic BP Percentile      Boys Diastolic BP Percentile      Pulse      Resp      Temp      Temp src      SpO2      Weight      Height      Head Circumference      Peak Flow      Pain Score      Pain Loc      Pain Education      Exclude from Growth Chart    No data found.  Updated Vital Signs BP 110/67 (BP Location: Left Arm)   Pulse (!) 56   Temp 98.6 F (37 C) (Oral)   Resp 12   Wt 150 lb 1.6 oz (68.1 kg)   LMP 12/28/2023   SpO2 98%   BMI 25.76 kg/m       Physical Exam Vitals and nursing note reviewed.  Constitutional:      General: She is not in acute distress.    Appearance: Normal appearance. She is not  ill-appearing or toxic-appearing.  HENT:     Head: Normocephalic and atraumatic.  Eyes:     General: No scleral icterus.       Right eye: No discharge.        Left eye: No discharge.     Conjunctiva/sclera: Conjunctivae normal.  Cardiovascular:     Rate and Rhythm: Normal rate and regular rhythm.     Heart sounds: Normal heart sounds.  Pulmonary:     Effort: Pulmonary effort is normal. No respiratory distress.     Breath sounds: Normal breath sounds.  Abdominal:     Palpations: Abdomen is soft.     Tenderness: There is no abdominal tenderness. There is no right CVA tenderness or left CVA tenderness.     Comments: Deferred rectal exam.  Patient declines.  Musculoskeletal:     Cervical back: Neck supple.  Skin:    General: Skin is dry.  Neurological:     General: No focal deficit present.     Mental Status: She is alert. Mental status is at baseline.     Motor: No weakness.     Gait: Gait normal.  Psychiatric:        Mood and Affect: Mood normal.        Behavior: Behavior normal.      UC Treatments / Results  Labs (all labs ordered are listed, but only abnormal results are displayed) Labs Reviewed  POCT URINE DIPSTICK - Abnormal; Notable for the following components:      Result Value   Spec Grav, UA >=1.030 (*)    Protein Ur, POC =30 (*)    All other components within normal limits    EKG   Radiology No results found.  Procedures Procedures (including critical care time)  Medications Ordered in UC Medications - No data to display  Initial Impression / Assessment and Plan / UC Course  I have reviewed the triage vital signs and the nursing notes.  Pertinent labs & imaging results that were available during my care of the patient were reviewed by me and considered in my medical decision making (see chart for details).   48 year old female presents for possible UTI.  Reports bladder discomfort but denies painful urination, urgency or frequency.  Currently  dealing with  hemorrhoids and anal fissures as well as rectal pain.  Denies anal bleeding, constipation, diarrhea, fever.  Has been using topical lidocaine .  Declines rectal exam.  Patient says she is confident she has hemorrhoids and anal fissures.  Urinalysis was negative for infection.  Reviewed this with her.  Placed a referral to GI for evaluation of persistent hemorrhoids and anal fissures.  Sent Anusol suppository.  Advised senna, rest and fluids and continue topical lidocaine .  Reviewed returning if acute worsening of pain or bleeding.  Otherwise, to follow-up with GI.   Final Clinical Impressions(s) / UC Diagnoses   Final diagnoses:  Dysuria  Hemorrhoids, unspecified hemorrhoid type  Anal fissure     Discharge Instructions      - The urine is clear today. - I put a referral into GI specialist for you. - Continue lidocaine  topical.  Can also use witch hazel pads, Tylenol  and Motrin.  I sent a suppository for you to try.  Also try over-the-counter senna vegetable laxative she cannot strain to have bowel movements. - Continue to use the doughnut pillow and warm soaks.     ED Prescriptions     Medication Sig Dispense Auth. Provider   hydrocortisone (ANUSOL-HC) 25 MG suppository Place 1 suppository (25 mg total) rectally 2 (two) times daily as needed for hemorrhoids or anal itching. 12 suppository Arvis Jolan NOVAK, PA-C      PDMP not reviewed this encounter.     [1]  Social History Tobacco Use   Smoking status: Former   Smokeless tobacco: Never  Vaping Use   Vaping status: Never Used  Substance Use Topics   Alcohol use: Not Currently    Comment: occasionly   Drug use: No     Arvis Jolan NOVAK, PA-C 01/04/24 1225

## 2024-01-04 NOTE — Discharge Instructions (Addendum)
-   The urine is clear today. - I put a referral into GI specialist for you. - Continue lidocaine  topical.  Can also use witch hazel pads, Tylenol  and Motrin.  I sent a suppository for you to try.  Also try over-the-counter senna vegetable laxative she cannot strain to have bowel movements. - Continue to use the doughnut pillow and warm soaks.

## 2024-02-09 ENCOUNTER — Emergency Department
Admission: EM | Admit: 2024-02-09 | Discharge: 2024-02-09 | Disposition: A | Discharge status: Home / Self Care | Payer: Self-pay | Attending: Emergency Medicine | Admitting: Emergency Medicine

## 2024-02-09 ENCOUNTER — Other Ambulatory Visit: Payer: Self-pay

## 2024-02-09 DIAGNOSIS — K6289 Other specified diseases of anus and rectum: Secondary | ICD-10-CM

## 2024-02-09 DIAGNOSIS — K0889 Other specified disorders of teeth and supporting structures: Secondary | ICD-10-CM | POA: Insufficient documentation

## 2024-02-09 NOTE — ED Notes (Signed)
 Patient asked to speak with Dr. Viviann prior to discharge. Dr. Viviann aware.

## 2024-02-09 NOTE — Discharge Instructions (Addendum)
 For hemorrhoids: Anusol  HC suppository, Tucks witch hazel pad, Sitz bath  For constipation: Senna/Docuate: stool softener. Take 2 caps twice a day Miralax: laxative. Take 1 capful in a full glass of water twice a day until you've had multiple smooth bowel movements Glycerin suppository: rectal lubricant. Use as needed for painful bowel movements. Dulcolax suppository: rectal lubricant. Use once every 6 hours for painful bowel movements. Magnesium citrate: gut stimulant. Take 1 bottle once a day, as needed for severe constipation Magnesium Oxide: 800mg  twice a day for 10 days Psyllium: fiber supplement. Softens stool Prunes and fruits: fiber and laxative effect

## 2024-02-09 NOTE — ED Notes (Signed)
 Patient states she took  4 tablets of Ibuprofen this morning at 0630. Patient states she has taken too much Ibuprofen this week due to the pain.

## 2024-02-09 NOTE — ED Triage Notes (Signed)
 Pt to ED via POV from home. Pt reports has been having issues with constipation had a rectal tear. Pt reports has been having constipation and now has a new tear and the pain is unbearable. Denies bleeding. Hx of hemorrhoids.

## 2024-02-09 NOTE — ED Provider Notes (Signed)
" ° °  [facility] Provider Note    Event Date/Time   First MD Initiated Contact with Patient 02/09/24 [phone]     (approximate)   History   Rectal Pain   HPI  Amanda Johnson is a 49 y.o. female with no significant past medical history, perimenopausal, who reports constipation and rectal pain for the last 6 months.  Has tried Anusol  HC recommended by urgent care recently without improvement.  Does report some external hemorrhoids as well.  Reports very painful bowel movements.  No bleeding.  No nausea or vomiting.  Able to eat and drink normally, but does have a fear of eating due to painful bowel movements and so avoids food.     Physical Exam   Triage Vital Signs: ED Triage Vitals [02/09/24 0830]  Encounter Vitals Group     BP 123/87     Girls Systolic BP Percentile      Girls Diastolic BP Percentile      Boys Systolic BP Percentile      Boys Diastolic BP Percentile      Pulse Rate 75     Resp 18     Temp 98 F (36.7 C)     Temp Source Oral     SpO2 100 %     Weight      Height      Head Circumference      Peak Flow      Pain Score 6     Pain Loc      Pain Education      Exclude from Growth Chart     Most recent vital signs: Vitals:   02/09/24 0830  BP: 123/87  Pulse: 75  Resp: 18  Temp: 98 F (36.7 C)  SpO2: 100%    General: Awake, no distress.  CV:  Good peripheral perfusion.  Resp:  Normal effort.  Abd:  No distention.  Soft nontender Other:  Moist mucosa   ED Results / Procedures / Treatments   Labs (all labs ordered are listed, but only abnormal results are displayed) Labs Reviewed - No data to display   RADIOLOGY    PROCEDURES:  Procedures   MEDICATIONS ORDERED IN ED: Medications - No data to display   IMPRESSION / MDM / ASSESSMENT AND PLAN / ED COURSE  I reviewed the triage vital signs and the nursing notes.                             Patient presents with chronic constipation/rectal pain/hemorrhoids.  No  bleeding.  No signs of bowel obstruction.  Abdominal exam is benign.  Vitals are normal.  Will need aggressive stool softener regimen.  She is interested in naturopathic approach, plans to continue senna, focus on fluid intake, fruit sugars, Chia seeds for fiber.  Recommend follow-up with surgery clinic.      FINAL CLINICAL IMPRESSION(S) / ED DIAGNOSES   Final diagnoses:  Rectal pain     Rx / DC Orders   ED Discharge Orders     None        Note:  This document was prepared using Dragon voice recognition software and may include unintentional dictation errors.   Viviann Pastor, MD 02/09/24 1020  "

## 2024-03-05 ENCOUNTER — Ambulatory Visit: Payer: Self-pay

## 2024-03-05 ENCOUNTER — Ambulatory Visit: Admission: RE | Admit: 2024-03-05 | Payer: Self-pay | Source: Home / Self Care | Admitting: Gastroenterology

## 2024-03-05 ENCOUNTER — Encounter: Admission: RE | Payer: Self-pay | Source: Home / Self Care
